# Patient Record
Sex: Female | Born: 1935 | Race: White | Hispanic: No | Smoking: Former smoker
Health system: Southern US, Community
[De-identification: ages and names within clinical notes are randomized; demographics above are authoritative.]

## PROBLEM LIST (undated history)

## (undated) DIAGNOSIS — F0390 Unspecified dementia without behavioral disturbance: Secondary | ICD-10-CM

## (undated) DIAGNOSIS — S42309A Unspecified fracture of shaft of humerus, unspecified arm, initial encounter for closed fracture: Secondary | ICD-10-CM

## (undated) DIAGNOSIS — N390 Urinary tract infection, site not specified: Secondary | ICD-10-CM

## (undated) DIAGNOSIS — R600 Localized edema: Secondary | ICD-10-CM

## (undated) DIAGNOSIS — I509 Heart failure, unspecified: Secondary | ICD-10-CM

## (undated) HISTORY — PX: FRACTURE SURGERY: SHX138

## (undated) HISTORY — PX: HIP FRACTURE SURGERY: SHX118

---

## 2003-04-30 ENCOUNTER — Encounter: Payer: Self-pay | Admitting: *Deleted

## 2003-04-30 ENCOUNTER — Encounter: Payer: Self-pay | Admitting: Internal Medicine

## 2003-04-30 ENCOUNTER — Inpatient Hospital Stay (HOSPITAL_COMMUNITY): Admission: EM | Admit: 2003-04-30 | Discharge: 2003-05-01 | Payer: Self-pay | Admitting: *Deleted

## 2012-02-09 DIAGNOSIS — M79609 Pain in unspecified limb: Secondary | ICD-10-CM

## 2012-02-11 DIAGNOSIS — I509 Heart failure, unspecified: Secondary | ICD-10-CM

## 2012-02-29 ENCOUNTER — Emergency Department (HOSPITAL_COMMUNITY): Payer: Medicare Other

## 2012-02-29 ENCOUNTER — Encounter (HOSPITAL_COMMUNITY): Payer: Self-pay

## 2012-02-29 ENCOUNTER — Emergency Department (HOSPITAL_COMMUNITY)
Admission: EM | Admit: 2012-02-29 | Discharge: 2012-02-29 | Disposition: A | Discharge status: Home / Self Care | Payer: Medicare Other | Attending: Emergency Medicine | Admitting: Emergency Medicine

## 2012-02-29 DIAGNOSIS — S32509A Unspecified fracture of unspecified pubis, initial encounter for closed fracture: Secondary | ICD-10-CM | POA: Insufficient documentation

## 2012-02-29 DIAGNOSIS — Z79899 Other long term (current) drug therapy: Secondary | ICD-10-CM | POA: Insufficient documentation

## 2012-02-29 DIAGNOSIS — W19XXXA Unspecified fall, initial encounter: Secondary | ICD-10-CM | POA: Insufficient documentation

## 2012-02-29 DIAGNOSIS — M25559 Pain in unspecified hip: Secondary | ICD-10-CM | POA: Insufficient documentation

## 2012-02-29 DIAGNOSIS — I509 Heart failure, unspecified: Secondary | ICD-10-CM | POA: Insufficient documentation

## 2012-02-29 DIAGNOSIS — F068 Other specified mental disorders due to known physiological condition: Secondary | ICD-10-CM | POA: Insufficient documentation

## 2012-02-29 DIAGNOSIS — M25569 Pain in unspecified knee: Secondary | ICD-10-CM | POA: Insufficient documentation

## 2012-02-29 DIAGNOSIS — S32599A Other specified fracture of unspecified pubis, initial encounter for closed fracture: Secondary | ICD-10-CM

## 2012-02-29 HISTORY — DX: Heart failure, unspecified: I50.9

## 2012-02-29 HISTORY — DX: Unspecified fracture of shaft of humerus, unspecified arm, initial encounter for closed fracture: S42.309A

## 2012-02-29 HISTORY — DX: Localized edema: R60.0

## 2012-02-29 HISTORY — DX: Urinary tract infection, site not specified: N39.0

## 2012-02-29 HISTORY — DX: Unspecified dementia, unspecified severity, without behavioral disturbance, psychotic disturbance, mood disturbance, and anxiety: F03.90

## 2012-02-29 MED ORDER — HYDROCODONE-ACETAMINOPHEN 5-325 MG PO TABS: 2.0000 | ORAL_TABLET | ORAL | Status: AC | PRN

## 2012-02-29 NOTE — ED Provider Notes (Signed)
History   This chart was scribed for Glynn Octave, MD by Charolett Bumpers . The patient was seen in room APA04/APA04. Patient's care was started at 0939.    CSN: [id number]  Arrival date & time 02/29/12  4098   First MD Initiated Contact with Patient 02/29/12 [phone]      Chief Complaint  Patient presents with  . Fall    (Consider location/radiation/quality/duration/timing/severity/associated sxs/prior treatment) HPI Julie Berg is a 78 y.o. female who has a h/o falls presents to the Emergency Department complaining of slight, constant left hip pain after an un-witnessed fall that occurred last night. Pt is from [facility], per EMS, staff found pt on floor. Pt has no memory of the fall. Pt denies any headache, neck pain, chest pain, abdominal pain, SOB, back pain. Pt is in a sling due to previous fracture to right arm. Pt reports ambulating without assistance. Pt denies any other injuries or complaints of pain at this time.    Past Medical History  Diagnosis Date  . CHF (congestive heart failure)   . Humerus fracture   . Dementia   . UTI (lower urinary tract infection)   . Pedal edema     History reviewed. No pertinent past surgical history.  No family history on file.  History  Substance Use Topics  . Smoking status: Never Smoker   . Smokeless tobacco: Not on file  . Alcohol Use: No    OB History    Grav Para Term Preterm Abortions TAB SAB Ect Mult Living                  Review of Systems A complete 10 system review of systems was obtained and all systems are negative except as noted in the HPI and PMH.   Allergies  Review of patient's allergies indicates no known allergies.  Home Medications   Current Outpatient Rx  Name Route Sig Dispense Refill  . ALPRAZOLAM 0.25 MG PO TABS Oral Take 0.25 mg by mouth 2 (two) times daily.    Marland Kitchen CALCIUM CARBONATE-VITAMIN D 500-200 MG-UNIT PO TABS Oral Take 1 tablet by mouth 2 (two) times daily.    Marland Kitchen  VITAMIN D 1000 UNITS PO TABS Oral Take 1,000 Units by mouth daily.    Marland Kitchen CIPROFLOXACIN HCL 750 MG PO TABS Oral Take 750 mg by mouth 2 (two) times daily.    Marland Kitchen DIPHENHYDRAMINE HCL 25 MG PO TABS Oral Take 25 mg by mouth daily.    . FUROSEMIDE 20 MG PO TABS Oral Take 20 mg by mouth every other day.    Marland Kitchen LOPERAMIDE HCL 2 MG PO CAPS Oral Take 2 mg by mouth daily.    Marland Kitchen PROMOD PO LIQD Oral Take by mouth 2 (two) times daily.    Marland Kitchen SIMETHICONE 80 MG PO CHEW Oral Chew 160 mg by mouth daily.    . DISPOSABLE ENEMA 19-7 GM/118ML RE ENEM Rectal Place 1 enema rectally once. follow package directions    . ZINC GLUCONATE 50 MG PO TABS Oral Take 50 mg by mouth daily.    Marland Kitchen HYDROCODONE-ACETAMINOPHEN 5-325 MG PO TABS Oral Take 2 tablets by mouth every 4 (four) hours as needed for pain. 15 tablet 0    BP 114/62  Pulse 81  Temp 98.9 F (37.2 C) (Oral)  Resp 18  SpO2 97%  Physical Exam  Nursing note and vitals reviewed. Constitutional: She is oriented to person, place, and time. She appears well-developed and well-nourished. No distress.  HENT:  Head: Normocephalic and atraumatic.  Right Ear: External ear normal.  Left Ear: External ear normal.  Nose: Nose normal.  Eyes: EOM are normal. Pupils are equal, round, and reactive to light.  Neck: Normal range of motion. Neck supple. No tracheal deviation present.  Cardiovascular: Normal rate, regular rhythm, normal heart sounds and intact distal pulses.   Pulses:      Radial pulses are 2+ on the right side, and 2+ on the left side.       Femoral pulses are 2+ on the right side, and 2+ on the left side.      Dorsalis pedis pulses are 2+ on the right side, and 2+ on the left side.       Posterior tibial pulses are 2+ on the right side, and 2+ on the left side.  Pulmonary/Chest: Effort normal and breath sounds normal. No respiratory distress. She has no wheezes.  Abdominal: Soft. She exhibits no distension. There is no tenderness.  Musculoskeletal: Normal range of  motion. She exhibits tenderness. She exhibits no edema.        Right arm in sling. Cathectic spine that is non-tender, no c-spine, t-spine or l-spine tenderness. Slight pain with left hip flexion.    Neurological: She is alert and oriented to person, place, and time. No cranial nerve deficit or sensory deficit.       Cranial nerves 3-12 intact. Strength 5/5 throughout all extremities. Equal grip strength.  Skin: Skin is warm and dry.       Right heel ulcer 2X3 cm, that is clean based   Psychiatric: She has a normal mood and affect. Her behavior is normal.    ED Course  Procedures (including critical care time)   DIAGNOSTIC STUDIES: Oxygen Saturation is 95% on room air, adequate by my interpretation.    COORDINATION OF CARE:  09:50-Discussed planned course of treatment with the patient including CT of head and x-rays of left hip and femur, who is agreeable at this time.    Labs Reviewed - No data to display Dg Hip Complete Left  02/29/2012  *RADIOLOGY REPORT*  Clinical Data: Fall, knee pain.  LEFT HIP - COMPLETE 2+ VIEW  Comparison: None.  Findings: There is a nondisplaced fracture through the left inferior pubic ramus.  No additional acute bony abnormality.  Mild osteopenia.  No proximal femoral abnormality.  IMPRESSION: Nondisplaced fracture through the left inferior pubic ramus.   Original Report Authenticated By: Cyndie Chime, M.D.    Dg Femur Left  02/29/2012  *RADIOLOGY REPORT*  Clinical Data: Fall, knee pain.  LEFT FEMUR - 2 VIEW  Comparison: Left hip series performed today.  Findings: Left inferior pubic ramus fracture noted, better seen on the hip series.  No additional acute bony abnormality.  No femoral fracture, subluxation or dislocation.  No joint effusion within the left knee.  IMPRESSION: Left inferior pubic ramus fracture.   Original Report Authenticated By: Cyndie Chime, M.D.    Ct Head Wo Contrast  02/29/2012  *RADIOLOGY REPORT*  Clinical Data: Unwitnessed fall  the last evening. Dementia.  CT HEAD WITHOUT CONTRAST  Technique:  Contiguous axial images were obtained from the base of the skull through the vertex without contrast.  Comparison: 02/09/2012.  Findings: Progressive opacification left mastoid air cells without skull fracture identified.  Soft tissue process left external auditory canal extends to the ossicle region.  This may represent cerumen and simple mastoid air cell opacification although a cholesteatoma or destructive process is  not excluded.  If further delineation is clinically desired, CT of the temporal bones can be performed.  No intracranial hemorrhage.  Global atrophy with ventricular prominence suggesting there may be a mild component of hydrocephalus unchanged from prior exam.  Small vessel disease type changes.  No CT evidence of large acute infarct.  No parenchymal mass identified on this unenhanced exam.  IMPRESSION: No skull fracture or intracranial hemorrhage.  Progressive opacification left mastoid air cells and left external auditory canal.  Please see above.  Global atrophy with ventricular prominence suggesting there may be a mild component of hydrocephalus unchanged from prior exam.  Small vessel disease type changes.   Original Report Authenticated By: Fuller Canada, M.D.      1. Inferior pubic ramus fracture   2. Fall       MDM  Unwitnessed fall from nursing home on the floor this morning. Patient complains of left hip pain. Denies headache, neck pain, back pain, abdominal pain or chest pain.  Neurovascularly intact. Full range of motion of bilateral hips without deformity. CT head findings noted.  No soft tissue destructive process seen.  Cerumen in ear canal. No mastoid or tragus pain.  Inferior pelvic ramus fracture noted. Discussed with patient. Pain well controlled in ED. She will need to be nonweightbearing and have followup with Dr. Hilda Lias.  I personally performed the services described in this documentation,  which was scribed in my presence.  The recorded information has been reviewed and considered.      Glynn Octave, MD 02/29/12 [phone]

## 2012-02-29 NOTE — ED Notes (Signed)
Per ems pt from [facility].  Per ems, pt had an un witnessed fall last night.  Staff reports that they found the pt in the floor.  Pt does not remember falling.  Pt reports pain to her left leg, hip, knee, and foot.

## 2012-02-29 NOTE — ED Notes (Signed)
Julie Berg [phone]

## 2012-05-13 ENCOUNTER — Encounter (HOSPITAL_COMMUNITY): Payer: Self-pay | Admitting: Emergency Medicine

## 2012-05-13 ENCOUNTER — Emergency Department (HOSPITAL_COMMUNITY): Payer: Medicare Other

## 2012-05-13 ENCOUNTER — Emergency Department (HOSPITAL_COMMUNITY)
Admission: EM | Admit: 2012-05-13 | Discharge: 2012-05-13 | Disposition: A | Discharge status: Home / Self Care | Payer: Medicare Other | Attending: Emergency Medicine | Admitting: Emergency Medicine

## 2012-05-13 DIAGNOSIS — R609 Edema, unspecified: Secondary | ICD-10-CM | POA: Insufficient documentation

## 2012-05-13 DIAGNOSIS — F0390 Unspecified dementia without behavioral disturbance: Secondary | ICD-10-CM | POA: Insufficient documentation

## 2012-05-13 DIAGNOSIS — Y9389 Activity, other specified: Secondary | ICD-10-CM | POA: Insufficient documentation

## 2012-05-13 DIAGNOSIS — E876 Hypokalemia: Secondary | ICD-10-CM | POA: Insufficient documentation

## 2012-05-13 DIAGNOSIS — W19XXXA Unspecified fall, initial encounter: Secondary | ICD-10-CM | POA: Insufficient documentation

## 2012-05-13 DIAGNOSIS — Y9210 Unspecified residential institution as the place of occurrence of the external cause: Secondary | ICD-10-CM | POA: Insufficient documentation

## 2012-05-13 DIAGNOSIS — I509 Heart failure, unspecified: Secondary | ICD-10-CM | POA: Insufficient documentation

## 2012-05-13 DIAGNOSIS — T1490XA Injury, unspecified, initial encounter: Secondary | ICD-10-CM | POA: Insufficient documentation

## 2012-05-13 DIAGNOSIS — N390 Urinary tract infection, site not specified: Secondary | ICD-10-CM | POA: Insufficient documentation

## 2012-05-13 DIAGNOSIS — Z79899 Other long term (current) drug therapy: Secondary | ICD-10-CM | POA: Insufficient documentation

## 2012-05-13 DIAGNOSIS — Z87828 Personal history of other (healed) physical injury and trauma: Secondary | ICD-10-CM | POA: Insufficient documentation

## 2012-05-13 LAB — URINALYSIS, ROUTINE W REFLEX MICROSCOPIC
Glucose, UA: NEGATIVE mg/dL
Leukocytes, UA: NEGATIVE
Protein, ur: 30 mg/dL — AB

## 2012-05-13 LAB — URINE MICROSCOPIC-ADD ON

## 2012-05-13 LAB — CBC WITH DIFFERENTIAL/PLATELET
Basophils Absolute: 0 10*3/uL (ref 0.0–0.1)
Basophils Relative: 0 % (ref 0–1)
Eosinophils Relative: 1 % (ref 0–5)
HCT: 37.2 % (ref 36.0–46.0)
Lymphocytes Relative: 25 % (ref 12–46)
MCH: 31.3 pg (ref 26.0–34.0)
MCHC: 34.7 g/dL (ref 30.0–36.0)
MCV: 90.3 fL (ref 78.0–100.0)
Monocytes Absolute: 0.6 10*3/uL (ref 0.1–1.0)
RDW: 14.5 % (ref 11.5–15.5)

## 2012-05-13 LAB — BASIC METABOLIC PANEL
CO2: 25 mEq/L (ref 19–32)
Calcium: 9.9 mg/dL (ref 8.4–10.5)
Creatinine, Ser: 0.74 mg/dL (ref 0.50–1.10)

## 2012-05-13 MED ORDER — POTASSIUM CHLORIDE CRYS ER 20 MEQ PO TBCR
40.0000 meq | EXTENDED_RELEASE_TABLET | Freq: Once | ORAL | Status: AC
  Administered 2012-05-13: 40 meq via ORAL
  Filled 2012-05-13: qty 2

## 2012-05-13 MED ORDER — POTASSIUM CHLORIDE CRYS ER 20 MEQ PO TBCR: 20.0000 meq | EXTENDED_RELEASE_TABLET | Freq: Two times a day (BID) | ORAL | Status: AC

## 2012-05-13 NOTE — ED Notes (Signed)
Pt and family reports that she fell this am while in the bathroom, pt stated she was unsure why she fell, she is at [facility] for 17 days for rehab and plans to return home. Denies loc. Family requested that pt be evaluated for any breaks or re-injuries.   Pt denies any complaints and stated she is pain free at present.

## 2012-05-13 NOTE — ED Notes (Signed)
Per nursing facility pt fell at the floor last night. Pt has no complaints at this time. Per ems pt walked to the stretcher without any assistance. Per pt she sat in the bathroom floor last night she did not fall. Pt has hx of dementia. Pt's family wants pt evaluated for any broken bones.

## 2012-05-13 NOTE — ED Notes (Signed)
[facility] called for transport back to [facility]. 

## 2012-05-13 NOTE — ED Notes (Signed)
Julie Berg, from lab called potassium 2.5, dr.davidson notified.

## 2012-05-13 NOTE — ED Provider Notes (Signed)
History  This chart was scribed for Carleene Cooper III, MD by Erskine Emery. This patient was seen in room APA05/APA05 and the patient's care was started at 08:56.   CSN: [id number]  Arrival date & time 05/13/12  0836   First MD Initiated Contact with Patient 05/13/12 0856    Level 5 Caveat--Dementia    Chief Complaint  Patient presents with  . Fall    (Consider location/radiation/quality/duration/timing/severity/associated sxs/prior Treatment) Level 5 Caveat--Dementia  The history is provided by the patient and a caregiver. The history is limited by the condition of the patient. No language interpreter was used.  Julie Berg is a 78 y.o. female with a h/o dementia who presents to the Emergency Department complaining of a fall. Pt lives at a nursing home and the nursing staff report she may have fallen in the bathroom last night, but it's possible she was just sitting on the floor. Pt is a fall risk. She fractured her hip and shoulder after her last major fall in August. Pt was just released from [facility] the day before yesterday after a couple days of being admitted for severe dehydration and a UTI. Pt's daughter reports she is off the antibiotics she was being treated with (Rocephin). Pt takes Benadryl every morning for her allergies, Lasix every other day, Xanax, and several nutritional supplements. Pt denies eating any breakfast today. Pt's daughter reports she is allergic to ciprofloxacin.  Dr. Cardell Peach at [facility] and Dr. Selena Batten are the pt's PCPs.  Past Medical History  Diagnosis Date  . CHF (congestive heart failure)   . Humerus fracture   . Dementia   . UTI (lower urinary tract infection)   . Pedal edema     History reviewed. No pertinent past surgical history.  No family history on file.  History  Substance Use Topics  . Smoking status: Never Smoker   . Smokeless tobacco: Not on file  . Alcohol Use: No    OB History    Grav Para Term Preterm Abortions TAB  SAB Ect Mult Living                  Review of Systems  Constitutional: Negative for fever.  HENT: Negative for congestion, sinus pressure and ear discharge.   Eyes: Negative for discharge.  Respiratory: Negative for cough.   Cardiovascular: Negative for chest pain.  Gastrointestinal: Negative for abdominal pain and diarrhea.  Genitourinary: Negative for hematuria.  Musculoskeletal: Negative for back pain.  Skin: Negative for rash.  Neurological: Negative for seizures and headaches.  Hematological: Negative.   Psychiatric/Behavioral: Negative for hallucinations.  All other systems reviewed and are negative.    Allergies  Review of patient's allergies indicates no known allergies.  Home Medications   Current Outpatient Rx  Name Route Sig Dispense Refill  . ALPRAZOLAM 0.25 MG PO TABS Oral Take 0.25 mg by mouth 2 (two) times daily.    Marland Kitchen CALCIUM CARBONATE-VITAMIN D 500-200 MG-UNIT PO TABS Oral Take 1 tablet by mouth 2 (two) times daily.    Marland Kitchen VITAMIN D 1000 UNITS PO TABS Oral Take 1,000 Units by mouth daily.    Marland Kitchen CIPROFLOXACIN HCL 750 MG PO TABS Oral Take 750 mg by mouth 2 (two) times daily.    Marland Kitchen DIPHENHYDRAMINE HCL 25 MG PO TABS Oral Take 25 mg by mouth daily.    . FUROSEMIDE 20 MG PO TABS Oral Take 20 mg by mouth every other day.    Marland Kitchen LOPERAMIDE HCL 2 MG PO  CAPS Oral Take 2 mg by mouth daily.    Marland Kitchen PROMOD PO LIQD Oral Take by mouth 2 (two) times daily.    Marland Kitchen SIMETHICONE 80 MG PO CHEW Oral Chew 160 mg by mouth daily.    . DISPOSABLE ENEMA 19-7 GM/118ML RE ENEM Rectal Place 1 enema rectally once. follow package directions    . ZINC GLUCONATE 50 MG PO TABS Oral Take 50 mg by mouth daily.      BP 127/66  Pulse 86  Temp 98.4 F (36.9 C) (Oral)  Resp 20  SpO2 98%  Physical Exam  Nursing note and vitals reviewed. Constitutional: She is oriented to person, place, and time. She appears well-developed and well-nourished. No distress.       Frail. Awake and conversing.  HENT:    Head: Normocephalic and atraumatic.  Eyes: EOM are normal. Pupils are equal, round, and reactive to light.  Neck: Neck supple. No tracheal deviation present.  Cardiovascular: Normal rate.   Pulmonary/Chest: Effort normal. No respiratory distress.  Abdominal: Soft. She exhibits no distension.  Musculoskeletal: Normal range of motion. She exhibits no edema.       Pain in left shoulder. 3+ edema up to knees in both lower legs. Feet are in booties to protect from decubitus ulcers.  Neurological: She is alert and oriented to person, place, and time.  Skin: Skin is warm and dry.  Psychiatric: She has a normal mood and affect.    ED Course  Procedures (including critical care time) DIAGNOSTIC STUDIES: Oxygen Saturation is 98% on room air, normal by my interpretation.    COORDINATION OF CARE: 9:15--I evaluated the patient and we discussed a treatment plan including x-rays of the shoulder and pelvis to which the pt and her daughter agreed.  9:37--Nurses report the pt's daughter is requesting just right hip and left shoulder x-rays.  10:45--I rechecked the pt and explained to her the results of her tests. I told her that her x-rays show she still has a fracture in her shoulder and pelvic but there are no fractures. I informed the pt that her potassium levels are low, so she needs to take potassium pills.   Results for orders placed during the hospital encounter of 05/13/12  CBC WITH DIFFERENTIAL      Component Value Range   WBC 8.6  4.0 - 10.5 K/uL   RBC 4.12  3.87 - 5.11 MIL/uL   Hemoglobin 12.9  12.0 - 15.0 g/dL   HCT 78.2  95.6 - 21.3 %   MCV 90.3  78.0 - 100.0 fL   MCH 31.3  26.0 - 34.0 pg   MCHC 34.7  30.0 - 36.0 g/dL   RDW 08.6  57.8 - 46.9 %   Platelets 232  150 - 400 K/uL   Neutrophils Relative 67  43 - 77 %   Neutro Abs 5.7  1.7 - 7.7 K/uL   Lymphocytes Relative 25  12 - 46 %   Lymphs Abs 2.2  0.7 - 4.0 K/uL   Monocytes Relative 7  3 - 12 %   Monocytes Absolute 0.6  0.1 -  1.0 K/uL   Eosinophils Relative 1  0 - 5 %   Eosinophils Absolute 0.1  0.0 - 0.7 K/uL   Basophils Relative 0  0 - 1 %   Basophils Absolute 0.0  0.0 - 0.1 K/uL  BASIC METABOLIC PANEL      Component Value Range   Sodium 145  135 - 145 mEq/L  Potassium 2.5 (*) 3.5 - 5.1 mEq/L   Chloride 111  96 - 112 mEq/L   CO2 25  19 - 32 mEq/L   Glucose, Bld 111 (*) 70 - 99 mg/dL   BUN 8  6 - 23 mg/dL   Creatinine, Ser 9.60  0.50 - 1.10 mg/dL   Calcium 9.9  8.4 - 45.4 mg/dL   GFR calc non Af Amer 77 (*) >90 mL/min   GFR calc Af Amer 89 (*) >90 mL/min   Dg Chest 1 View  05/13/2012  *RADIOLOGY REPORT*  Clinical Data: Status post fall  CHEST - 1 VIEW  Comparison: None  Findings: The heart size appears mildly enlarged.  The lung volumes are low.  There are coarsened interstitial markings noted bilaterally.  Atelectasis is noted in both lung bases.  Calcified lymph nodes identified within the left hilar region.  IMPRESSION:  1.  Chronic interstitial coarsening and bibasilar atelectasis. 2.  Prior granulomatous disease.   Original Report Authenticated By: Signa Kell, M.D.    Dg Lumbar Spine Complete  05/13/2012  *RADIOLOGY REPORT*  Clinical Data: Fall.  LUMBAR SPINE - COMPLETE 4+ VIEW  Comparison: None  Findings: The bones appear diffusely osteopenic.  The alignment appears normal.  There is multilevel, age indeterminate compression deformities throughout the lower thoracic and lumbar spine. Multilevel disc space narrowing and ventral endplate spurring is also noted.   IMPRESSION: 1.  Osteopenia and multilevel compression deformities throughout the lower thoracic and lumbar spine.   Original Report Authenticated By: Signa Kell, M.D.    Dg Hip Complete Right  05/13/2012  *RADIOLOGY REPORT*  Clinical Data: Fall.  History of CHF.  RIGHT HIP - COMPLETE 2+ VIEW  Comparison: None  Findings: Healing fracture involving the left superior and inferior pubic rami identified.  No acute fracture or subluxation is  identified.  There is no evidence of arthropathy or other focal bone abnormality.  Soft tissues are unremarkable.  IMPRESSION:  1.  No acute findings. 2.  Healing fractures involving the left pubic rami.   Original Report Authenticated By: Signa Kell, M.D.    Dg Shoulder Left  05/13/2012  *RADIOLOGY REPORT*  Clinical Data: Status post fall.  LEFT SHOULDER - 2+ VIEW  Comparison: None  Findings: The bones appear osteopenic.  No dislocation. Nondisplaced fractures involve the greater tuberosity of the left humerus and surgical neck.  Chronic interstitial changes noted within the visualized lung fields.  There are calcified lymph nodes noted within the left hilar region.  IMPRESSION:  1.  Comminuted fracture involves the proximal left humerus.   Original Report Authenticated By: Signa Kell, M.D.       1. Fall   2. Hypokalemia      X-rays negative.  K was low at 2.5.  Rx KCL 20 meq bid.  I reviewed her x-ray results with her and with her daughter.  Safe to return to [facility].  I personally performed the services described in this documentation, which was scribed in my presence. The recorded information has been reviewed and considered.  Osvaldo Human, MD      Carleene Cooper III, MD 05/13/12 1051

## 2012-05-14 LAB — URINE CULTURE
Colony Count: NO GROWTH
Culture: NO GROWTH

## 2014-06-24 ENCOUNTER — Emergency Department (HOSPITAL_COMMUNITY)
Admission: EM | Admit: 2014-06-24 | Discharge: 2014-06-24 | Disposition: A | Discharge status: Home / Self Care | Payer: Medicare Other | Attending: Emergency Medicine | Admitting: Emergency Medicine

## 2014-06-24 ENCOUNTER — Encounter (HOSPITAL_COMMUNITY): Payer: Self-pay | Admitting: Emergency Medicine

## 2014-06-24 ENCOUNTER — Emergency Department (HOSPITAL_COMMUNITY): Payer: Medicare Other

## 2014-06-24 DIAGNOSIS — Z87440 Personal history of urinary (tract) infections: Secondary | ICD-10-CM | POA: Insufficient documentation

## 2014-06-24 DIAGNOSIS — I82412 Acute embolism and thrombosis of left femoral vein: Secondary | ICD-10-CM

## 2014-06-24 DIAGNOSIS — I509 Heart failure, unspecified: Secondary | ICD-10-CM | POA: Diagnosis not present

## 2014-06-24 DIAGNOSIS — I82402 Acute embolism and thrombosis of unspecified deep veins of left lower extremity: Secondary | ICD-10-CM | POA: Insufficient documentation

## 2014-06-24 DIAGNOSIS — R2242 Localized swelling, mass and lump, left lower limb: Secondary | ICD-10-CM | POA: Diagnosis present

## 2014-06-24 DIAGNOSIS — Z87891 Personal history of nicotine dependence: Secondary | ICD-10-CM | POA: Insufficient documentation

## 2014-06-24 DIAGNOSIS — R2241 Localized swelling, mass and lump, right lower limb: Secondary | ICD-10-CM | POA: Diagnosis not present

## 2014-06-24 DIAGNOSIS — M25561 Pain in right knee: Secondary | ICD-10-CM | POA: Diagnosis not present

## 2014-06-24 DIAGNOSIS — M25562 Pain in left knee: Secondary | ICD-10-CM | POA: Insufficient documentation

## 2014-06-24 DIAGNOSIS — I5032 Chronic diastolic (congestive) heart failure: Secondary | ICD-10-CM

## 2014-06-24 DIAGNOSIS — Z79899 Other long term (current) drug therapy: Secondary | ICD-10-CM | POA: Insufficient documentation

## 2014-06-24 DIAGNOSIS — F0390 Unspecified dementia without behavioral disturbance: Secondary | ICD-10-CM | POA: Insufficient documentation

## 2014-06-24 DIAGNOSIS — Z8781 Personal history of (healed) traumatic fracture: Secondary | ICD-10-CM | POA: Diagnosis not present

## 2014-06-24 DIAGNOSIS — R609 Edema, unspecified: Secondary | ICD-10-CM

## 2014-06-24 LAB — BASIC METABOLIC PANEL
Anion gap: 9 (ref 5–15)
BUN: 32 mg/dL — ABNORMAL HIGH (ref 6–23)
CHLORIDE: 99 meq/L (ref 96–112)
CO2: 31 meq/L (ref 19–32)
Calcium: 9.4 mg/dL (ref 8.4–10.5)
Creatinine, Ser: 1 mg/dL (ref 0.50–1.10)
GFR calc Af Amer: 58 mL/min — ABNORMAL LOW (ref >90)
GFR, EST NON AFRICAN AMERICAN: 50 mL/min — AB (ref >90)
GLUCOSE: 137 mg/dL — AB (ref 70–99)
POTASSIUM: 4.6 meq/L (ref 3.7–5.3)
SODIUM: 139 meq/L (ref 137–147)

## 2014-06-24 LAB — CBC WITH DIFFERENTIAL/PLATELET
Basophils Absolute: 0 10*3/uL (ref 0.0–0.1)
Basophils Relative: 0 % (ref 0–1)
EOS ABS: 0 10*3/uL (ref 0.0–0.7)
Eosinophils Relative: 1 % (ref 0–5)
HCT: 38.1 % (ref 36.0–46.0)
HEMOGLOBIN: 12.4 g/dL (ref 12.0–15.0)
LYMPHS ABS: 1.1 10*3/uL (ref 0.7–4.0)
LYMPHS PCT: 16 % (ref 12–46)
MCH: 33.2 pg (ref 26.0–34.0)
MCHC: 32.5 g/dL (ref 30.0–36.0)
MCV: 101.9 fL — ABNORMAL HIGH (ref 78.0–100.0)
MONOS PCT: 6 % (ref 3–12)
Monocytes Absolute: 0.4 10*3/uL (ref 0.1–1.0)
NEUTROS ABS: 5.4 10*3/uL (ref 1.7–7.7)
NEUTROS PCT: 77 % (ref 43–77)
PLATELETS: 224 10*3/uL (ref 150–400)
RBC: 3.74 MIL/uL — AB (ref 3.87–5.11)
RDW: 13 % (ref 11.5–15.5)
WBC: 7 10*3/uL (ref 4.0–10.5)

## 2014-06-24 MED ORDER — XARELTO VTE STARTER PACK 15 & 20 MG PO TBPK: 15.0000 mg | ORAL_TABLET | ORAL | Status: AC

## 2014-06-24 MED ORDER — ENOXAPARIN SODIUM 60 MG/0.6ML ~~LOC~~ SOLN
1.0000 mg/kg | Freq: Two times a day (BID) | SUBCUTANEOUS | Status: DC
  Administered 2014-06-24: 55 mg via SUBCUTANEOUS
  Filled 2014-06-24: qty 0.6

## 2014-06-24 NOTE — ED Provider Notes (Signed)
CSN: [id number]     Arrival date & time 06/24/14  1131 History   First MD Initiated Contact with Patient 06/24/14 1525     Chief Complaint  Patient presents with  . Leg Swelling      The history is provided by a caregiver, a relative and the patient. The history is limited by the condition of the patient (Hx dementia).  Pt was seen at 1530. Per pt and her family: c/o L>R bilateral knees "pain" for the past 4 days. Pt's daughter has been giving her tylenol with transient relief. Pt's daughter noticed pt's LLE was "swelling" for the past 3 days, and is concerned "about her having another blood clot." Pt herself denies any complaints.   Past Medical History  Diagnosis Date  . CHF (congestive heart failure)   . Humerus fracture   . Dementia   . UTI (lower urinary tract infection)   . Pedal edema    Past Surgical History  Procedure Laterality Date  . Fracture surgery    . Hip fracture surgery     Family History  Problem Relation Age of Onset  . Osteoarthritis Father    History  Substance Use Topics  . Smoking status: Former Games developermoker  . Smokeless tobacco: Never Used  . Alcohol Use: No   OB History    Gravida Para Term Preterm AB TAB SAB Ectopic Multiple Living   5 4 4  1  1         Review of Systems  Unable to perform ROS: Dementia      Allergies  Ciprofloxacin hcl and Donepezil  Home Medications   Prior to Admission medications   Medication Sig Start Date End Date Taking? Authorizing Provider  acetaminophen (TYLENOL EX ST ARTHRITIS PAIN) 500 MG tablet Take 1,000 mg by mouth 2 (two) times daily.   Yes Historical Provider, MD  ALPRAZolam Prudy Feeler(XANAX) 1 MG tablet Take 0.5-1.5 mg by mouth at bedtime. Takes one and one-half tablet at bedtime and takes one-half tablet once daily as needed for agitation   Yes Historical Provider, MD  furosemide (LASIX) 20 MG tablet Take 40 mg by mouth every morning.    Yes Historical Provider, MD  loperamide (IMODIUM A-D) 2 MG tablet Take 3 mg by  mouth every morning.   Yes Historical Provider, MD  loperamide (IMODIUM) 2 MG capsule Take 2.5 mg by mouth daily.    Yes Historical Provider, MD  loratadine (CLARITIN) 10 MG tablet Take 10 mg by mouth every morning.   Yes Historical Provider, MD  potassium chloride (K-DUR) 10 MEQ tablet Take 10 mEq by mouth every morning. TAKE 1 TABLET DAILY 06/14/14  Yes Historical Provider, MD  ALPRAZolam (XANAX) 0.25 MG tablet Take 0.25 mg by mouth 2 (two) times daily.    Historical Provider, MD  potassium chloride SA (K-DUR,KLOR-CON) 20 MEQ tablet Take 1 tablet (20 mEq total) by mouth 2 (two) times daily. Patient not taking: Reported on 06/24/2014 05/13/12   Carleene CooperAlan Davidson, MD   BP 142/71 mmHg  Pulse 78  Temp(Src) 98.2 F (36.8 C) (Oral)  Resp 16  Ht 5\' 2"  (1.575 m)  Wt 116 lb (52.617 kg)  BMI 21.21 kg/m2  SpO2 98% Physical Exam  1535: Physical examination:  Nursing notes reviewed; Vital signs and O2 SAT reviewed;  Constitutional: Well developed, Well nourished, Well hydrated, In no acute distress; Head:  Normocephalic, atraumatic; Eyes: EOMI, PERRL, No scleral icterus; ENMT: Mouth and pharynx normal, Mucous membranes moist; Neck: Supple, Full range of  motion, No lymphadenopathy; Cardiovascular: Regular rate and rhythm, No gallop; Respiratory: Breath sounds clear & equal bilaterally, No wheezes.  Speaking full sentences with ease, Normal respiratory effort/excursion; Chest: Nontender, Movement normal; Abdomen: Soft, Nontender, Nondistended, Normal bowel sounds; Genitourinary: No CVA tenderness; Extremities: Pulses normal, No deformity. No bilat hips/knees/ankles/feet tenderness. Multiple bruises in various stages of healing to bilat anterior tibial areas.  +tr right pedal edema, +1 LLE edema with asymmetry.; Neuro: Awake, alert, confused per hx dementia. Major CN grossly intact. No facial droop. Speech clear. Moves all extremities on stretcher and to command without apparent gross focal motor deficits.; Skin:  Color normal, Warm, Dry.   ED Course  Procedures     EKG Interpretation None      MDM  MDM Reviewed: previous chart, nursing note and vitals Reviewed previous: labs Interpretation: labs, x-ray and ultrasound     Results for orders placed or performed during the hospital encounter of 06/24/14  CBC with Differential  Result Value Ref Range   WBC 7.0 4.0 - 10.5 K/uL   RBC 3.74 (L) 3.87 - 5.11 MIL/uL   Hemoglobin 12.4 12.0 - 15.0 g/dL   HCT 16.138.1 09.636.0 - 04.546.0 %   MCV 101.9 (H) 78.0 - 100.0 fL   MCH 33.2 26.0 - 34.0 pg   MCHC 32.5 30.0 - 36.0 g/dL   RDW 40.913.0 81.111.5 - 91.415.5 %   Platelets 224 150 - 400 K/uL   Neutrophils Relative % 77 43 - 77 %   Neutro Abs 5.4 1.7 - 7.7 K/uL   Lymphocytes Relative 16 12 - 46 %   Lymphs Abs 1.1 0.7 - 4.0 K/uL   Monocytes Relative 6 3 - 12 %   Monocytes Absolute 0.4 0.1 - 1.0 K/uL   Eosinophils Relative 1 0 - 5 %   Eosinophils Absolute 0.0 0.0 - 0.7 K/uL   Basophils Relative 0 0 - 1 %   Basophils Absolute 0.0 0.0 - 0.1 K/uL  Basic metabolic panel  Result Value Ref Range   Sodium 139 137 - 147 mEq/L   Potassium 4.6 3.7 - 5.3 mEq/L   Chloride 99 96 - 112 mEq/L   CO2 31 19 - 32 mEq/L   Glucose, Bld 137 (H) 70 - 99 mg/dL   BUN 32 (H) 6 - 23 mg/dL   Creatinine, Ser 7.821.00 0.50 - 1.10 mg/dL   Calcium 9.4 8.4 - 95.610.5 mg/dL   GFR calc non Af Amer 50 (L) >90 mL/min   GFR calc Af Amer 58 (L) >90 mL/min   Anion gap 9 5 - 15   [place] Venous Img Lower Unilateral Left 06/24/2014   CLINICAL DATA:  80 year old female with 3- 4 day history of left lower extremity swelling  EXAM: LEFT LOWER EXTREMITY VENOUS DOPPLER ULTRASOUND  TECHNIQUE: Gray-scale sonography with graded compression, as well as color Doppler and duplex ultrasound were performed to evaluate the lower extremity deep venous systems from the level of the common femoral vein and including the common femoral, femoral, profunda femoral, popliteal and calf veins including the posterior tibial, peroneal  and gastrocnemius veins when visible. The superficial great saphenous vein was also interrogated. Spectral Doppler was utilized to evaluate flow at rest and with distal augmentation maneuvers in the common femoral, femoral and popliteal veins.  COMPARISON:  None.  FINDINGS: Contralateral Common Femoral Vein: Respiratory phasicity is normal and symmetric with the symptomatic side. No evidence of thrombus. Normal compressibility.  Common Femoral Vein: Incompletely compressible. Limited flow noted on color Doppler.  Low-level internal echoes are present within the vascular lumen.  Saphenofemoral Junction: Incompletely compressible. Limited flow noted on color Doppler. Low-level internal echoes are present within the vascular lumen.  Profunda Femoral Vein: Incompletely compressible. Limited flow noted on color Doppler. Low-level internal echoes are present within the vascular lumen.  Femoral Vein: Incompletely compressible. Limited flow noted on color Doppler. Low-level internal echoes are present within the vascular lumen.  Popliteal Vein: Incompletely compressible. Limited flow noted on color Doppler. Low-level internal echoes are present within the vascular lumen.  Calf Veins: The calf veins are not well seen.  Superficial Great Saphenous Vein: No evidence of thrombus. Normal compressibility and flow on color Doppler imaging.  Venous Reflux:  None.  Other Findings:  None.  IMPRESSION: 1. Positive for nonocclusive thrombus extending from the common femoral vein through the popliteal vein. Similar findings are also present within the proximal and mid great saphenous vein and the profunda femoral vein. Findings may represent subocclusive acute DVT, or acute on partially recanalized chronic DVT.   Electronically Signed   By: Malachy Moan M.D.   On: 06/24/2014 16:39   Dg Knee Complete 4 Views Left 06/24/2014   CLINICAL DATA:  Left leg pain and swelling  EXAM: LEFT KNEE - COMPLETE 4+ VIEW  COMPARISON:  02/29/2012   FINDINGS: Locking intra medullary rod extending into the distal femur in satisfactory position.  Negative for fracture.  Joint spaces are normal.  Diffuse osteopenia.  IMPRESSION: No acute abnormality.   Electronically Signed   By: Marlan Palau M.D.   On: 06/24/2014 16:42   Dg Knee Complete 4 Views Right 06/24/2014   CLINICAL DATA:  Soreness in leg on Thursday, edema on Friday worsening over weekend, history CHF, dementia  EXAM: RIGHT KNEE - COMPLETE 4+ VIEW  COMPARISON:  02/09/2012  FINDINGS: Diffuse osseous demineralization.  Joint spaces preserved.  No acute fracture, dislocation or bone destruction.  No knee joint effusion.  Scattered atherosclerotic calcifications.  IMPRESSION: Osseous demineralization.  No acute osseous abnormalities.   Electronically Signed   By: Ulyses Southward M.D.   On: 06/24/2014 16:43    1735:  Dx and testing d/w pt and family.  Questions answered.  Verb understanding. Family states pt has hx of DVT and "wants her to go on that coumadin again." Risks/benefits of anticoagulants discussed (bleeding risk, esp with falls). Family states pt does have hx of frequent falls "but is better since she has been living with me."  T/C to [facility]. Konrad Dolores, case discussed, including:  HPI, pertinent PM/SHx, VS/PE, dx testing, ED course and treatment:  Agreeable to come to ED for evaluation.    1850:  [facility] Dr. Konrad Dolores has evaluated pt in the ED: after speaking extensively with family, family would like to take her home tonight, they also have decided to give a dose of SQ lovenox tonight and give a rx for xarelto; apparently family wants to speak with her PMD tomorrow before starting on xarelto vs coumadin/lovenox. Family would like to take pt home now. Agreeable to f/u with PMD tomorrow.    Samuel Jester, DO 06/26/14 [phone]

## 2014-06-24 NOTE — ED Notes (Signed)
Pt alert & oriented x4, stable gait. Patient given discharge instructions, paperwork & prescription(s). Patient verbalized understanding. Pt left department by wheelchair w/ no further questions. 

## 2014-06-24 NOTE — ED Notes (Addendum)
Alert, talking, NAD at present.  Daughter upset about wait.

## 2014-06-24 NOTE — Progress Notes (Signed)
ANTICOAGULATION CONSULT NOTE - Initial Consult  Pharmacy Consult for Lovenox Indication: DVT  Allergies  Allergen Reactions  . Ciprofloxacin Hcl Rash  . Donepezil Diarrhea    Patient Measurements: Height: 5\' 2"  (157.5 cm) Weight: 116 lb (52.617 kg) IBW/kg (Calculated) : 50.1 Heparin Dosing Weight:   Vital Signs: Temp: 98.2 F (36.8 C) (12/14 1135) Temp Source: Oral (12/14 1135) BP: 142/71 mmHg (12/14 1641) Pulse Rate: 78 (12/14 1641)  Labs:  Recent Labs  06/24/14 1235  HGB 12.4  HCT 38.1  PLT 224  CREATININE 1.00    Estimated Creatinine Clearance: 32.5 mL/min (by C-G formula based on Cr of 1).   Medical History: Past Medical History  Diagnosis Date  . CHF (congestive heart failure)   . Humerus fracture   . Dementia   . UTI (lower urinary tract infection)   . Pedal edema     Medications:  Scheduled:  . enoxaparin (LOVENOX) injection  1 mg/kg Subcutaneous Q12H    Assessment: Lovenox for VTE treatment, LLE DVT Patient weight 52.6 kg CrCl 32.5 ml/min Labs reviewed PTA medications reviewed  Goal of Therapy:  Anti-Xa level 0.6-1 units/ml 4hrs after LMWH dose given Monitor platelets by anticoagulation protocol: Yes   Plan:  Lovenox 1 mg/kg (55 mg) SQ every 12 hours Monitor renal function F/U transition to oral therapy Labs per protocol  Raquel Jamesittman, Zion Lint Bennett 06/24/2014,7:22 PM

## 2014-06-24 NOTE — Discharge Instructions (Signed)
°Emergency Department Resource Guide °1) Find a Doctor and Pay Out of Pocket °Although you won't have to find out who is covered by your insurance plan, it is a good idea to ask around and get recommendations. You will then need to call the office and see if the doctor you have chosen will accept you as a new patient and what types of options they offer for patients who are self-pay. Some doctors offer discounts or will set up payment plans for their patients who do not have insurance, but you will need to ask so you aren't surprised when you get to your appointment. ° °2) Contact Your Local Health Department °Not all health departments have doctors that can see patients for sick visits, but many do, so it is worth a call to see if yours does. If you don't know where your local health department is, you can check in your phone book. The CDC also has a tool to help you locate your state's health department, and many state websites also have listings of all of their local health departments. ° °3) Find a Walk-in Clinic °If your illness is not likely to be very severe or complicated, you may want to try a walk in clinic. These are popping up all over the country in pharmacies, drugstores, and shopping centers. They're usually staffed by nurse practitioners or physician assistants that have been trained to treat common illnesses and complaints. They're usually fairly quick and inexpensive. However, if you have serious medical issues or chronic medical problems, these are probably not your best option. ° °No Primary Care Doctor: °- Call Health Connect at  832-8000 - they can help you locate a primary care doctor that  accepts your insurance, provides certain services, etc. °- Physician Referral Service- 1-800-533-3463 ° °Chronic Pain Problems: °Organization         Address  Phone   Notes  °[facility]  (336) 297-2271 Patients need to be referred by their primary care doctor.  ° °Medication  Assistance: °Organization         Address  Phone   Notes  °[place] [facility] [address] °[place], [place] [address] (336) 641-8030 --Must be a resident of [place] °-- Must have NO insurance coverage whatsoever (no Medicaid/ Medicare, etc.) °-- The pt. MUST have a primary care doctor that directs their care regularly and follows them in the community °  °[facility]  (866) 331-1348   °[employer]  (888) 892-1162   ° °Agencies that provide inexpensive medical care: °Organization         Address  Phone   Notes  °[facility]  (336) 832-8035   °[facility]    (336) 832-7272   °[facility] [address] °[place], [place] [address] (336) 832-4777   °[facility] [address], °[place] (336) 271-4999   °[facility]    (336) 373-0678   °[facility]    (336) 272-1050   °[facility] ° [address], [place] Phone:  (336) 832-4444, Fax:  (336) 832-4440 Hours of Operation:  9 am - 6 pm, M-F.  Also accepts Medicaid/Medicare and self-pay.  °[facility] ° [address], [place] Phone: (336) [phone], Fax: (336) [phone]. Hours of Operation:  8:30 am - 5:30 pm, M-F.  Also accepts Medicaid and self-pay.  °[facility] [place] [address], [place] Phone: (336) 878-6027   °[facility] [address], [place], [place] (336)723-1848, Ext. 123 Mondays & Thursdays: 7-9 AM.  First 15 patients are seen on a first come, first serve basis. °  ° °Medicaid-accepting [place] Providers: ° °Organization         Address  Phone   Notes  °[facility] [address], [place] (336) 641-2100 Also accepts self-pay patients.  °[facility] [address], [place] ° (336) 856-9996   °[facility] [address], [place]  (336) 288-8857   °[facility] [address] [place] [address], [place] (336) 299-7000   °Veita Bland [address], [place]  ° (336) 373-1557 Only accepts [place] Access Medicaid patients after they have their name applied to their card.  ° °Self-Pay (no insurance) in [place]: ° °Organization         Address  Phone   Notes  °Sickle Cell Patients, [facility] [address], [place] (336) 832-1970   °[facility] [address], [place] (336) 832-4400   °[facility] ° [address], [place] (336) 992-4800   °[facility]/Dr. Osei-Bonsu ° [address] [place] [address], [place] or [address], [place] (336) [phone] Phone number for both [place] and [place] locations is the same.  °[facility] [address], [place] (336) 299-0000   °[facility] [address] [place] [address], [place] or [address] (336) 852-7530 °(336) 878-2260   °[facility] [address], [place] (336) 350-1642, phone; (336) 294-5005, fax Sees patients 1st and 3rd Saturday of every month.  Must not qualify for public or private insurance (i.e. Medicaid, Medicare, [place] Health Choice, Veterans' Benefits) • Household income should be no more than 200% of the poverty level •The clinic cannot treat you if you are pregnant or think you are pregnant • Sexually transmitted diseases are not treated at the clinic.  ° ° °Dental Care: °Organization         Address  Phone  Notes  °[place] [facility] [address], [place] (336) 641-6152 Accepts children up to age 21 who are enrolled in Medicaid or [place] Health Choice; pregnant women with a Medicaid card; and children who have applied for Medicaid or [place] Health Choice, but were declined, whose parents can pay a reduced fee at time of service.  °[place]  [facility] [place]  [address], [place] (336) 641-7733 Accepts children up to age 21 who are enrolled in Medicaid or [place] Health Choice; pregnant women with a Medicaid card; and children who have applied for Medicaid or [place] Health Choice, but were declined, whose parents can pay a reduced fee at time of service.  °[facility] ° [address], [place] (336) 641-4533 Patients are seen by appointment only. Walk-ins are not accepted. [facility] will see patients 18 years of age and older. °Monday - Tuesday (8am-5pm) °Most Wednesdays (8:30-5pm) °$30 per visit, cash only  °[facility] ° [address], [place] (336) 641-4533 Patients are seen by appointment only. Walk-ins are not accepted. [facility] will see patients 18 years of age and older. °One   Wednesday Evening (Monthly: Volunteer Based).  $30 per visit, cash only  °[facility]  (919) 537-3737 for adults; Children under age 4, call Graduate Pediatric Dentistry at (919) [phone]. Children aged 4-14, please call (919) [phone] to request a pediatric application. ° Dental services are provided in all areas of dental care including fillings, crowns and bridges, complete and partial dentures, implants, gum treatment, root canals, and extractions. Preventive care is also provided. Treatment is provided to both adults and children. °Patients are selected via a lottery and there is often a waiting list. °  °[facility] [address], °[place] ° (336) 763-8833 [email or web address] °  °[facility] [address], [place], [place] (336)723-1848, Ext. 123 Second and Fourth Thursday of each month, opens at 6:30 AM; Clinic ends at 9 AM.  Patients are seen on a first-come first-served basis, and a limited number are seen during each clinic.  ° °[facility] ° [address], [place], [place] (336) 723-7904    Eligibility Requirements °You must have lived in [place], [place], or [place] counties for at least the last three months. °  You cannot be eligible for state or federal sponsored healthcare insurance, including Veterans Administration, Medicaid, or Medicare. °  You generally cannot be eligible for healthcare insurance through your employer.  °  How to apply: °Eligibility screenings are held every Tuesday and Wednesday afternoon from 1:00 pm until 4:00 pm. You do not need an appointment for the interview!  °[facility] [address], [place], [place] 336-631-2330   °[facility]  336-342-8273   °[facility]  336-703-3100   °[facility]  336-570-6415   ° °Behavioral Health Resources in the Community: °Intensive Outpatient Programs °Organization         Address  Phone  Notes  °[facility] [address], [place], [place] 336-878-6098   °[facility] [address], [place], [place] 336-832-9800   °[facility] [address], [place], [place] ° 336-882-2125   °[facility] [address],  °[place], [place] 1-800-853-5163 or 336-641-4981   °Substance Abuse Resources °Organization         Address  Phone  Notes  °[facility]  336-882-2125   °[facility]  336-784-9470   °[facility]  336-285-9073   °[facility]  336-845-3988   °Residential & Outpatient Substance Abuse Program  1-800-659-3381   °Psychological Services °Organization         Address  Phone  Notes  °[facility]  336- 832-9600   °[facility]  336- 378-7881   °[facility] [address], [place] 1-800-853-5163 or 336-641-4981   ° °Mobile Crisis Teams °Organization         Address  Phone  Notes  °[facility], Mobile Crisis Care Unit  1-877-626-1772   °Assertive °[facility] ° [address]  [place], [place] 336-834-9664   °Sharon DeEsch [address] °[place] 336-554-5454   ° °Self-Help/Support Groups °Organization         Address  Phone             Notes  °[facility] of  - variety of support groups  336- 373-1402 Call for more information  °Narcotics Anonymous (NA), [facility] [address], °[place]  2 meetings at this location  ° °  Residential Treatment Programs Organization         Address  Phone  Notes  [facility] [address],    [place]  [phone]   [facility]  [address], [place] [id number], [place], [place] [phone]   [facility] [address], [place] [phone] Admissions: 8am-3pm M-F  [facility] [address]    [place], [place] [phone]   [facility] [address], [place], [place] [phone]   [facility] [address]  [place], [place] [phone]   [facility] Intensive Outpatient [address], [place], [place] [phone]   [facility] [address]  [place], [place] [phone])[phone] or (332) [phone])[phone]   [facility] ([facility] [address] [place], [place] [phone](984) [phone] Accepts Medicaid  [facility] [address]  [place] [phone] Substance Abuse/Addiction Treatment   [facility] Resources Organization         Address  Phone  Notes  [facility]  336-115-5263(888) (228)104-8223   Angie FavaJulie Brannon, PhD [address] [place], [place]   [phone](336) [phone] or (431)[phone](336) [phone]   [facility]   [address] [place], [place] [phone](336) [phone]   [facility] [address], [place], [place] [phone]) [phone] Insurance/Medicaid/sponsorship through [facility] [address]                                    [place], [place] [phone]) [phone] Therapy/tele-psych/case    [facility] [address]   [place], [place] [phone](336) [phone]    Dr. Lolly MustacheArfeen  [phone](336) [phone]   [facility]  [employer] [facility] 1) [address], [place] 2) [address], [place] 3)  [address], [place] [phone](336) [phone](336) ([phone]  ([phone](336) [phone]   [facility] Child Abuse Hotline 819-482-8386(336) 916-724-6795 or 251-565-7055(336) [phone] (After Hours)      Your ultrasound today showed a "blood clot" in the veins in your left leg.  You received an injection of a "blood thinning" medication during your Emergency Department visit today as starting treatment for the "blood clot" in your leg. Call your regular medical doctor tomorrow morning to schedule a follow up appointment for tomorrow. Your doctor is expecting to see you tomorrow to further discuss "blood thinning" medication options (ie: Xarelto vs lovenox/coumadin).  You received a prescription for Xarelto today if you decide tomorrow to start to take it. Return to the Emergency Department immediately if worsening.

## 2014-06-24 NOTE — ED Notes (Signed)
Left knee swelling. Patient has dementia, denies pain at present.

## 2014-06-24 NOTE — H&P (Addendum)
[facility] History and Physical  Julie Berg ZOX:[id number] DOB: 1926/08/27 DOA: 06/24/2014  Referring physician: Dr. Clarene Duke PCP: Marvis Repress, MD  Dr. Hilma Favors   Chief Complaint: Julie Berg swelling  HPI: Julie Berg is a 80 y.o. female  LE swelling. Started 2-3 days ago. L>R.Pt lives at home and is cared for by [employer]. Pt also complaining of knee pain for 4 days. Tylenol w/ mild relief. Getting worse. H/o DVT but has not been on coumadin for some time.  Denies CP, SOB, falls, fevers, ABd pain, dysuria, frequency, syncope, wt loss/gain, nausea, vomiting, diarrhea.   Pt demented adn history unreliable. Called Misty Stanley to get history as above  Review of Systems:  Per HPI w/ all other systems negative.   Past Medical History  Diagnosis Date  . CHF (congestive heart failure)   . Humerus fracture   . Dementia   . UTI (lower urinary tract infection)   . Pedal edema    Past Surgical History  Procedure Laterality Date  . Fracture surgery    . Hip fracture surgery     Social History:  reports that she has quit smoking. She has never used smokeless tobacco. She reports that she does not drink alcohol or use illicit drugs.  Allergies  Allergen Reactions  . Ciprofloxacin Hcl Rash  . Donepezil Diarrhea    Family History  Problem Relation Age of Onset  . Osteoarthritis Father      Prior to Admission medications   Medication Sig Start Date End Date Taking? Authorizing Provider  acetaminophen (TYLENOL EX ST ARTHRITIS PAIN) 500 MG tablet Take 1,000 mg by mouth 2 (two) times daily.   Yes Historical Provider, MD  ALPRAZolam Prudy Feeler) 1 MG tablet Take 0.5-1.5 mg by mouth at bedtime. Takes one and one-half tablet at bedtime and takes one-half tablet once daily as needed for agitation   Yes Historical Provider, MD  furosemide (LASIX) 20 MG tablet Take 40 mg by mouth every morning.    Yes Historical Provider, MD  loperamide (IMODIUM A-D) 2 MG tablet Take 3 mg by mouth every morning.    Yes Historical Provider, MD  loperamide (IMODIUM) 2 MG capsule Take 2.5 mg by mouth daily.    Yes Historical Provider, MD  loratadine (CLARITIN) 10 MG tablet Take 10 mg by mouth every morning.   Yes Historical Provider, MD  potassium chloride (K-DUR) 10 MEQ tablet Take 10 mEq by mouth every morning. TAKE 1 TABLET DAILY 06/14/14  Yes Historical Provider, MD  ALPRAZolam (XANAX) 0.25 MG tablet Take 0.25 mg by mouth 2 (two) times daily.    Historical Provider, MD  potassium chloride SA (K-DUR,KLOR-CON) 20 MEQ tablet Take 1 tablet (20 mEq total) by mouth 2 (two) times daily. Patient not taking: Reported on 06/24/2014 05/13/12   Carleene Cooper, MD   Physical Exam: Filed Vitals:   06/24/14 1135 06/24/14 1641  BP: 144/70 142/71  Pulse: 87 78  Temp: 98.2 F (36.8 C)   TempSrc: Oral   Resp: 16 16  Height: 5\' 2"  (1.575 m)   Weight: 52.617 kg (116 lb)   SpO2: 98% 98%    Wt Readings from Last 3 Encounters:  06/24/14 52.617 kg (116 lb)    General: Appears calm and comfortable Eyes:  PERRL, normal lids, irises & conjunctiva ENT:  grossly normal hearing, lips & tongue Neck:  no LAD, masses or thyromegaly Cardiovascular:  RRR, no m/r/g. LLE 2+ edema, RLE 1+ Telemetry:  SR, no arrhythmias  Respiratory:  CTA bilaterally, no w/r/r.  Normal respiratory effort. Abdomen:  soft, ntnd Skin:  Numerous ecchymoses bilat shins  Musculoskeletal:  grossly normal tone BUE/BLE Psychiatric:  grossly normal mood and affect, speech fluent and appropriate Neurologic:  grossly non-focal.          Labs on Admission:  Basic Metabolic Panel:  Recent Labs Lab 06/24/14 1235  NA 139  K 4.6  CL 99  CO2 31  GLUCOSE 137*  BUN 32*  CREATININE 1.00  CALCIUM 9.4   Liver Function Tests: No results for input(s): AST, ALT, ALKPHOS, BILITOT, PROT, ALBUMIN in the last 168 hours. No results for input(s): LIPASE, AMYLASE in the last 168 hours. No results for input(s): AMMONIA in the last 168 hours. CBC:  Recent  Labs Lab 06/24/14 1235  WBC 7.0  NEUTROABS 5.4  HGB 12.4  HCT 38.1  MCV 101.9*  PLT 224   Cardiac Enzymes: No results for input(s): CKTOTAL, CKMB, CKMBINDEX, TROPONINI in the last 168 hours.  BNP (last 3 results) No results for input(s): PROBNP in the last 8760 hours. CBG: No results for input(s): GLUCAP in the last 168 hours.  Radiological Exams on Admission: [place] Venous Img Lower Unilateral Left  06/24/2014   CLINICAL DATA:  80 year old female with 3- 4 day history of left lower extremity swelling  EXAM: LEFT LOWER EXTREMITY VENOUS DOPPLER ULTRASOUND  TECHNIQUE: Gray-scale sonography with graded compression, as well as color Doppler and duplex ultrasound were performed to evaluate the lower extremity deep venous systems from the level of the common femoral vein and including the common femoral, femoral, profunda femoral, popliteal and calf veins including the posterior tibial, peroneal and gastrocnemius veins when visible. The superficial great saphenous vein was also interrogated. Spectral Doppler was utilized to evaluate flow at rest and with distal augmentation maneuvers in the common femoral, femoral and popliteal veins.  COMPARISON:  None.  FINDINGS: Contralateral Common Femoral Vein: Respiratory phasicity is normal and symmetric with the symptomatic side. No evidence of thrombus. Normal compressibility.  Common Femoral Vein: Incompletely compressible. Limited flow noted on color Doppler. Low-level internal echoes are present within the vascular lumen.  Saphenofemoral Junction: Incompletely compressible. Limited flow noted on color Doppler. Low-level internal echoes are present within the vascular lumen.  Profunda Femoral Vein: Incompletely compressible. Limited flow noted on color Doppler. Low-level internal echoes are present within the vascular lumen.  Femoral Vein: Incompletely compressible. Limited flow noted on color Doppler. Low-level internal echoes are present within the vascular  lumen.  Popliteal Vein: Incompletely compressible. Limited flow noted on color Doppler. Low-level internal echoes are present within the vascular lumen.  Calf Veins: The calf veins are not well seen.  Superficial Great Saphenous Vein: No evidence of thrombus. Normal compressibility and flow on color Doppler imaging.  Venous Reflux:  None.  Other Findings:  None.  IMPRESSION: 1. Positive for nonocclusive thrombus extending from the common femoral vein through the popliteal vein. Similar findings are also present within the proximal and mid great saphenous vein and the profunda femoral vein. Findings may represent subocclusive acute DVT, or acute on partially recanalized chronic DVT.   Electronically Signed   By: Malachy MoanHeath  McCullough M.D.   On: 06/24/2014 16:39   Dg Knee Complete 4 Views Left  06/24/2014   CLINICAL DATA:  Left leg pain and swelling  EXAM: LEFT KNEE - COMPLETE 4+ VIEW  COMPARISON:  02/29/2012  FINDINGS: Locking intra medullary rod extending into the distal femur in satisfactory position.  Negative for fracture.  Joint spaces are normal.  Diffuse osteopenia.  IMPRESSION: No acute abnormality.   Electronically Signed   By: Marlan Palauharles  Clark M.D.   On: 06/24/2014 16:42   Dg Knee Complete 4 Views Right  06/24/2014   CLINICAL DATA:  Soreness in leg on Thursday, edema on Friday worsening over weekend, history CHF, dementia  EXAM: RIGHT KNEE - COMPLETE 4+ VIEW  COMPARISON:  02/09/2012  FINDINGS: Diffuse osseous demineralization.  Joint spaces preserved.  No acute fracture, dislocation or bone destruction.  No knee joint effusion.  Scattered atherosclerotic calcifications.  IMPRESSION: Osseous demineralization.  No acute osseous abnormalities.   Electronically Signed   By: Ulyses SouthwardMark  Boles M.D.   On: 06/24/2014 16:43    EKG: not done  Assessment/Plan Active Problems:   Diastolic CHF, chronic   Left femoral vein DVT  Called by Dr. Clarene DukeMcManus to ED to evaluate pt. Pt w/ recurrent DVT. Pt demented and not  able to participate in discussion regarding treatment options. Discussed treatment options w/ HCPOA, Misty StanleyLisa, and it was decided to give lovenox tonight and start on Xeralto. Pt to f/u w/ PCP on 06/25/14. Discussed bleed risks vs PE adn Misty StanleyLisa is aware of risk. States that pt rarely falls at home while under her care. Discussed anticoagulation and dispo w/ Dr. Clarene DukeMcManus who will discharge pt w/ appropriate medications and prescriptions.   CHF: chronic diastolic. Last Echo 2013. No sign of exacerbation. Continue current regimen  Dementia: near baseline per daughter. Treatment w/ Namenda and aricept not likely to benefit at this time. F/u PCP  Called and discussed case w/ on call doctor at PCPs office. Pts family to call in the am and request a same day appt to be seen.   Disposition Plan: Discharg  Time spent: >80 min in direct pt care adn coordination of care and counseling w/ pt and family members.   Ricardo Schubach J [facility] [email or web address] Password [email or web address]

## 2014-06-24 NOTE — ED Notes (Signed)
Pt developed "soreness in her leg on Thurs, developed edema on Fri. Family states edema has become much worse over the past couple of days. Edema at knee on L.

## 2014-06-24 NOTE — ED Notes (Signed)
Patient's daughter states she is leaving but will return. Wants to leave name and number to call if needed. Name is Misty StanleyLisa, number is [phone].

## 2015-08-13 DEATH — deceased

## 2016-05-13 IMAGING — CR DG KNEE COMPLETE 4+V*L*
1 series · 4 of 4 positions shown · non-contrast
Comparison: 02/29/2012

CLINICAL DATA: Left leg pain and swelling

EXAM:
LEFT KNEE - COMPLETE 4+ VIEW

[Series 2: lat · 0.17mm/px · 4 of 4 slices shown]
[im 1/4]
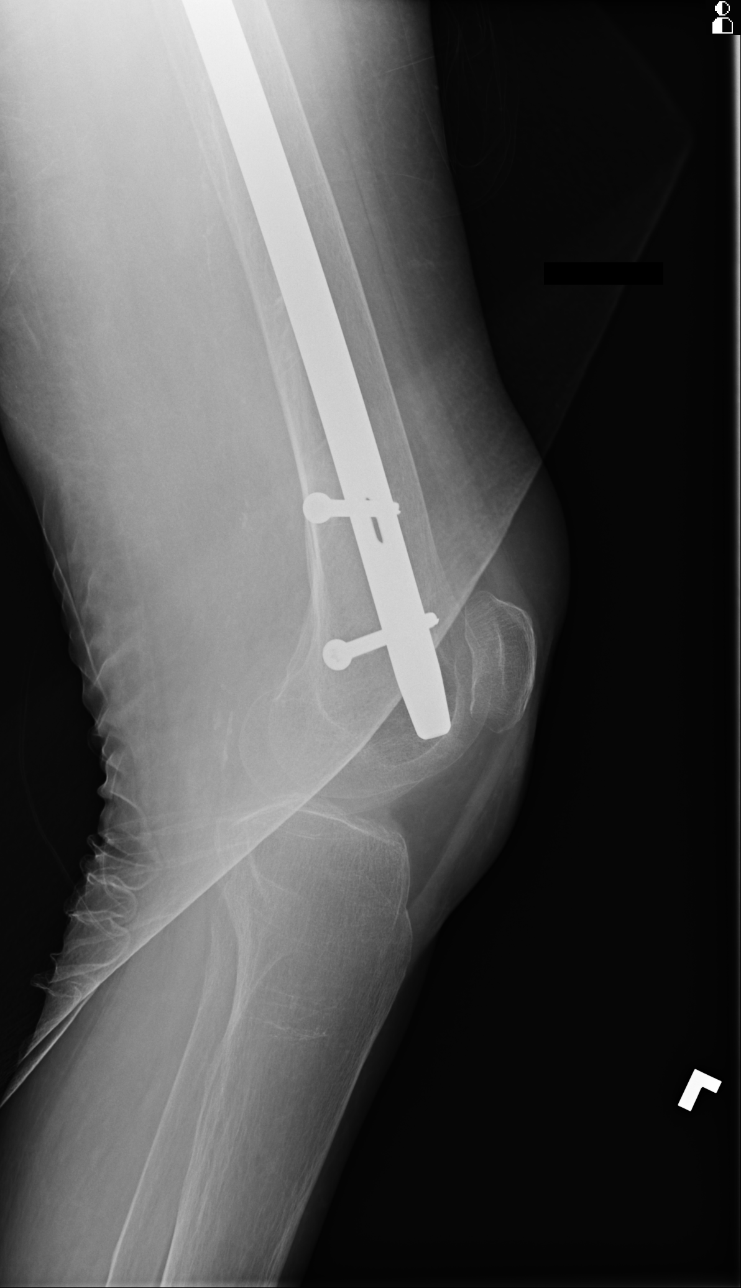
[im 2/4]
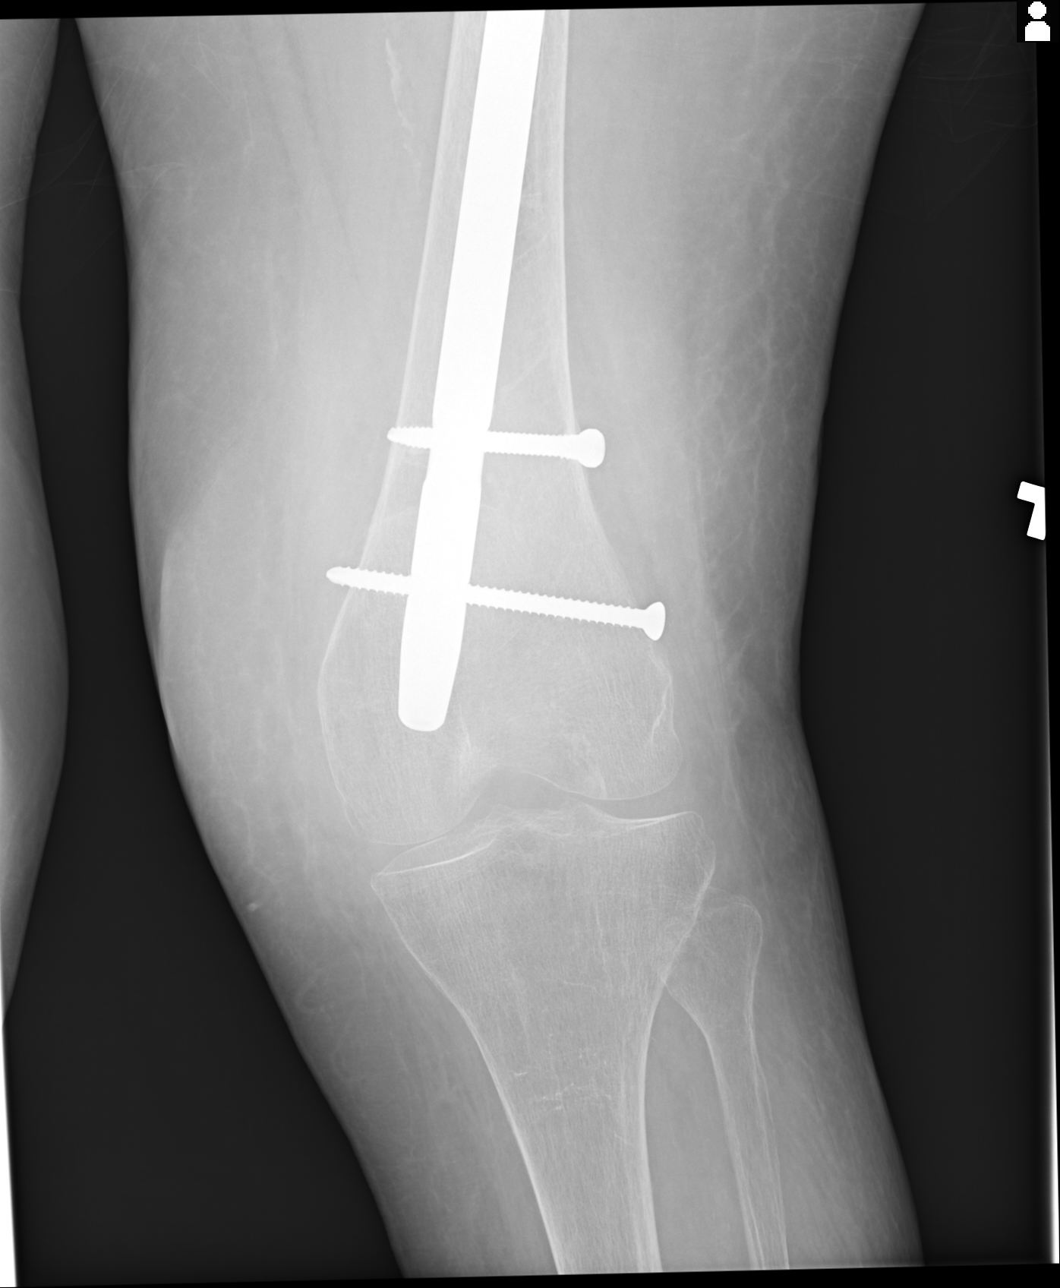
[im 3/4]
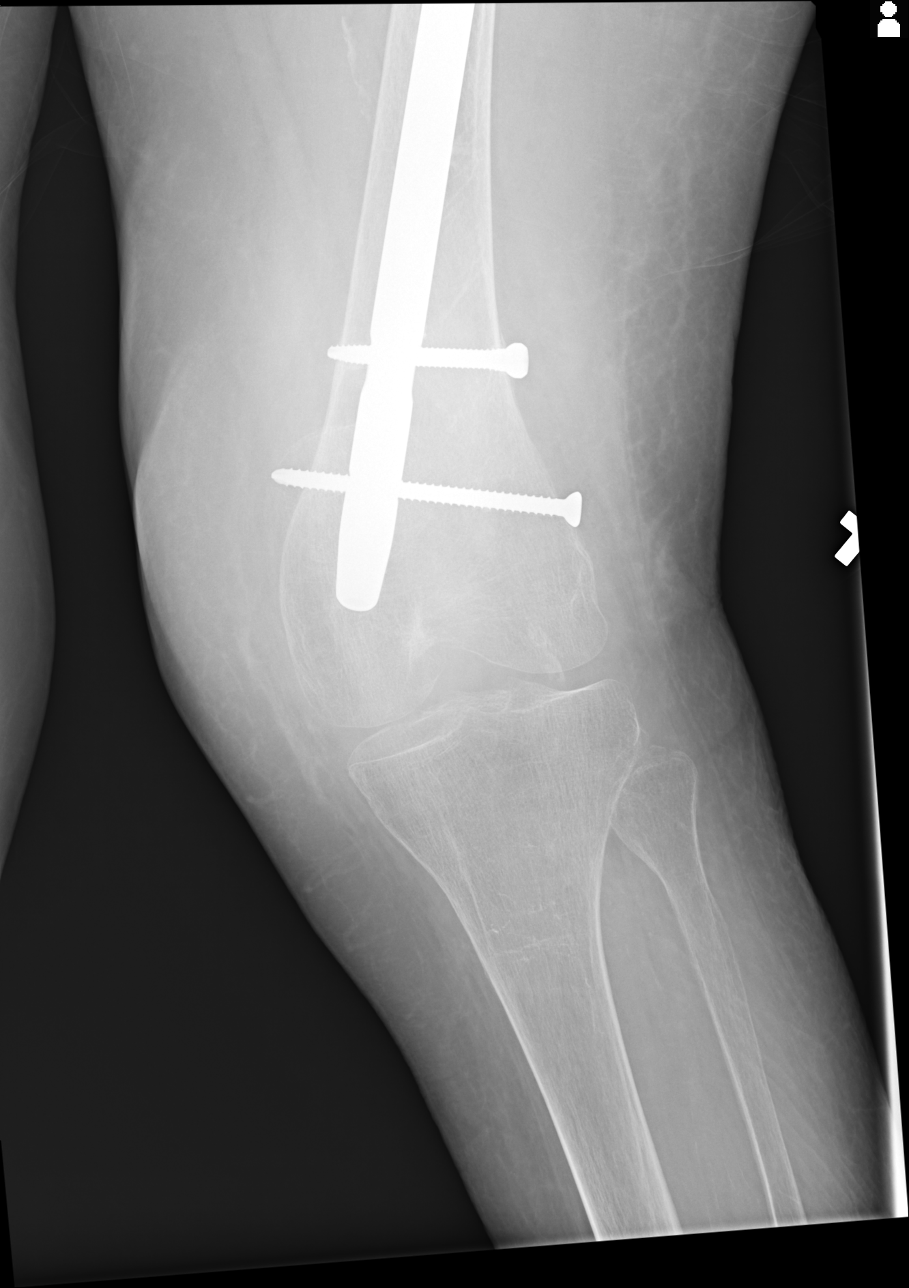
[im 4/4]
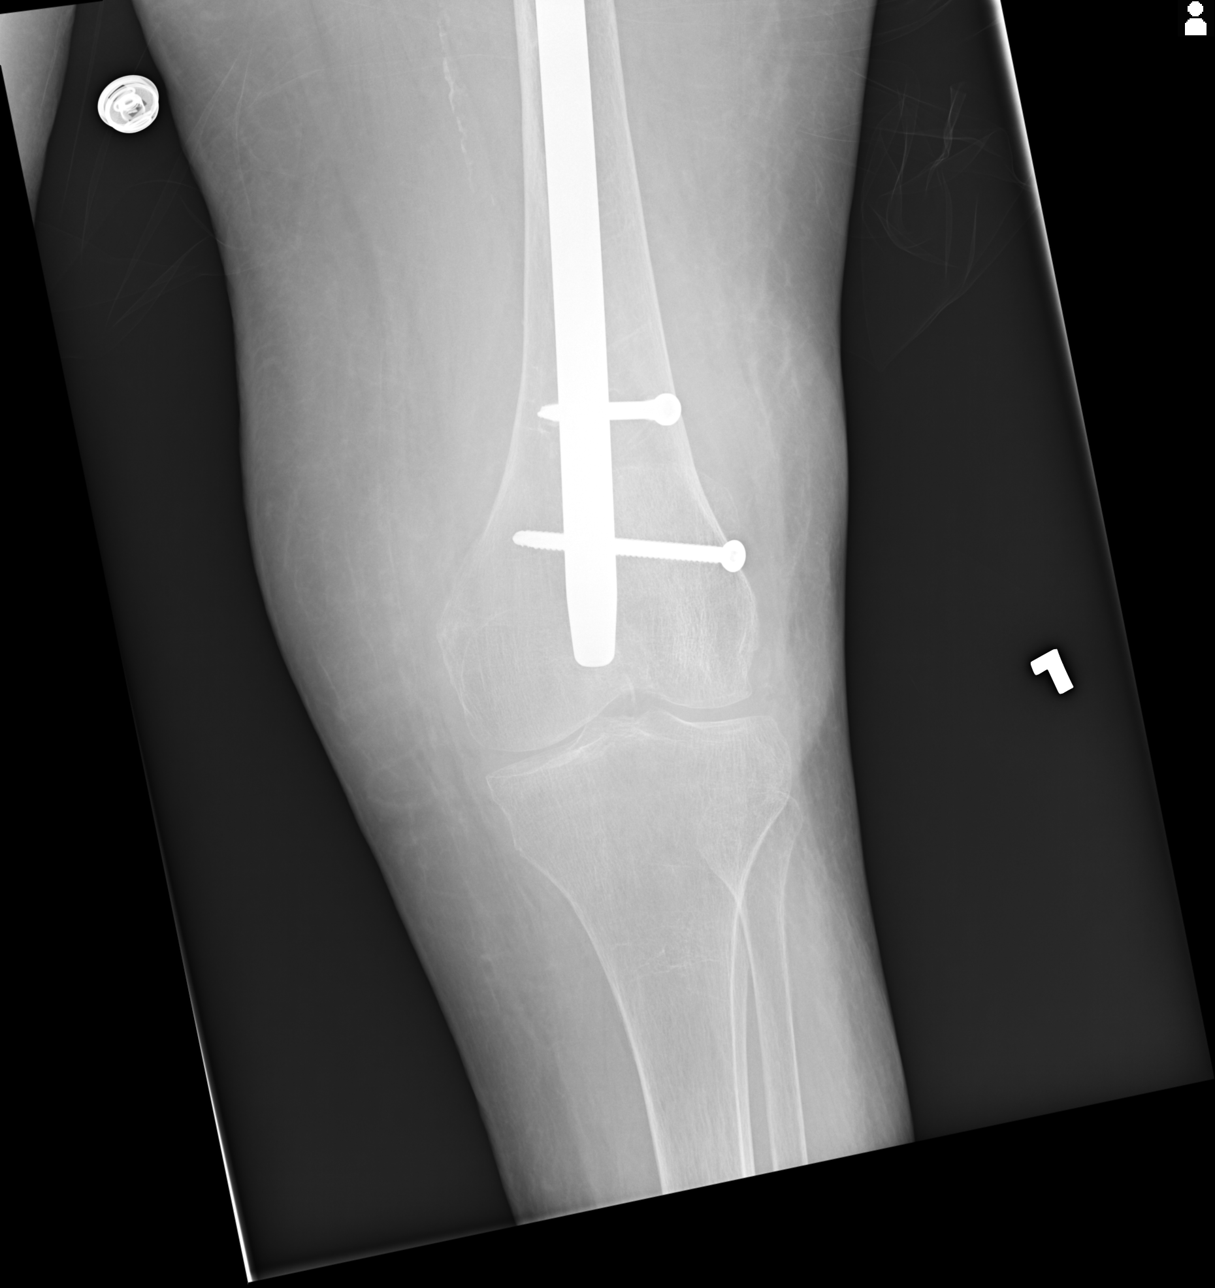

[4 of 4 positions shown; findings below may reference images not displayed]

FINDINGS: Locking intra medullary rod extending into the distal femur in
satisfactory position.

Negative for fracture.  Joint spaces are normal.

Diffuse osteopenia.
IMPRESSION: No acute abnormality.
